# Patient Record
Sex: Female | Born: 1995 | Race: White | Hispanic: No | Marital: Married | Smoking: Never smoker
Health system: Southern US, Community
[De-identification: ages and names within clinical notes are randomized; demographics above are authoritative.]

---

## 2013-06-14 ENCOUNTER — Emergency Department (HOSPITAL_COMMUNITY)
Admission: EM | Admit: 2013-06-14 | Discharge: 2013-06-14 | Disposition: A | Discharge status: Home / Self Care | Payer: BC Managed Care – PPO | Attending: Emergency Medicine | Admitting: Emergency Medicine

## 2013-06-14 ENCOUNTER — Emergency Department (HOSPITAL_COMMUNITY): Payer: BC Managed Care – PPO

## 2013-06-14 ENCOUNTER — Encounter (HOSPITAL_COMMUNITY): Payer: Self-pay | Admitting: Emergency Medicine

## 2013-06-14 DIAGNOSIS — Y9301 Activity, walking, marching and hiking: Secondary | ICD-10-CM | POA: Insufficient documentation

## 2013-06-14 DIAGNOSIS — Y9289 Other specified places as the place of occurrence of the external cause: Secondary | ICD-10-CM | POA: Insufficient documentation

## 2013-06-14 DIAGNOSIS — W010XXA Fall on same level from slipping, tripping and stumbling without subsequent striking against object, initial encounter: Secondary | ICD-10-CM | POA: Insufficient documentation

## 2013-06-14 DIAGNOSIS — S82402A Unspecified fracture of shaft of left fibula, initial encounter for closed fracture: Secondary | ICD-10-CM

## 2013-06-14 DIAGNOSIS — S82899A Other fracture of unspecified lower leg, initial encounter for closed fracture: Secondary | ICD-10-CM | POA: Insufficient documentation

## 2013-06-14 DIAGNOSIS — X500XXA Overexertion from strenuous movement or load, initial encounter: Secondary | ICD-10-CM | POA: Insufficient documentation

## 2013-06-14 MED ORDER — HYDROCODONE-ACETAMINOPHEN 5-325 MG PO TABS: 1.0000 | ORAL_TABLET | Freq: Four times a day (QID) | ORAL | Status: DC | PRN

## 2013-06-14 NOTE — ED Provider Notes (Signed)
CSN: [id number]     Arrival date & time 06/14/13  2050 History  This chart was scribed for non-physician practitioner Felicie Morn, NP  working with Celene Kras, MD by Leone Payor, ED Scribe. This patient was seen in room TR07C/TR07C and the patient's care was started at 2050.    Chief Complaint  Patient presents with  . Ankle Pain    The history is provided by the patient. No language interpreter was used.    HPI Comments: Kristin Sexton is a 17 y.o. female who presents to the Emergency Department complaining of a left ankle injury that occurred a few hours ago. Pt states she was walking in a parking lot when she slipped on something and twisted her left ankle. She has constant, unchanged left ankle pain and swelling to the affected area. She denies any other injuries. She denies any other symptoms.   History reviewed. No pertinent past medical history. History reviewed. No pertinent past surgical history. No family history on file. History  Substance Use Topics  . Smoking status: Never Smoker   . Smokeless tobacco: Not on file  . Alcohol Use: No   OB History   Grav Para Term Preterm Abortions TAB SAB Ect Mult Living                 Review of Systems  Musculoskeletal: Positive for arthralgias ( left ankle pain).  Neurological: Negative for weakness and numbness.  All other systems reviewed and are negative.    Allergies  Review of patient's allergies indicates no known allergies.  Home Medications  No current outpatient prescriptions on file. BP 108/61  Pulse 112  Temp(Src) 98.1 F (36.7 C) (Oral)  Resp 18  Ht 5\' 3"  (1.6 m)  Wt 140 lb (63.504 kg)  BMI 24.81 kg/m2  SpO2 99%  LMP 04/23/2013 Physical Exam  Nursing note and vitals reviewed. Constitutional: She is oriented to person, place, and time. She appears well-developed and well-nourished.  HENT:  Head: Normocephalic and atraumatic.  Cardiovascular: Normal rate.   Pulmonary/Chest: Effort normal.   Abdominal: She exhibits no distension.  Musculoskeletal:  Tenderness at the lateral left ankle. Limited ROM due to pain  Neurological: She is alert and oriented to person, place, and time.  Sensation intact  Skin: Skin is warm and dry.  Good cap refill.   Psychiatric: She has a normal mood and affect.    ED Course  Procedures   DIAGNOSTIC STUDIES: Oxygen Saturation is 99% on RA, normal by my interpretation.    COORDINATION OF CARE: 10:20 PM Discussed treatment plan with pt at bedside and pt agreed to plan.   Labs Review Labs Reviewed - No data to display Imaging Review Dg Ankle Complete Left  06/14/2013   CLINICAL DATA:  Twisting injury to left ankle, lateral left ankle pain.  EXAM: LEFT ANKLE COMPLETE - 3+ VIEW  COMPARISON:  None.  FINDINGS: There appears to be a minimally displaced fracture involving the distal tip of the fibula. No additional fractures are seen. The ankle mortise remains intact.  No significant soft tissue abnormalities are characterized on radiograph. The subtalar joint is unremarkable in appearance.  IMPRESSION: Minimally displaced fracture involving the distal tip of the fibula.   Electronically Signed   By: Roanna Raider M.D.   On: 06/14/2013 21:38    EKG Interpretation   None      Radiology results reviewed and shared with patient. MDM  Distal left fibular fracture.  Cam walker, analgesic,  ortho follow-up.  I personally performed the services described in this documentation, which was scribed in my presence. The recorded information has been reviewed and is accurate.   Jimmye Norman, NP 06/15/13 Marlyne Beards

## 2013-06-14 NOTE — Progress Notes (Signed)
Orthopedic Tech Progress Note Patient Details:  Kristin Sexton August 11, 1995 [id number]  Ortho Devices Type of Ortho Device: CAM walker Ortho Device/Splint Location: LLE Ortho Device/Splint Interventions: Ordered;Application   Jennye Moccasin 06/14/2013, 10:34 PM

## 2013-06-14 NOTE — ED Notes (Signed)
Pt comfortable with d/c and f/u instructions. Prescriptions x1 

## 2013-06-14 NOTE — ED Notes (Signed)
Pt st's she slipped in wet parking lot and turned left ankle over.  Pain and swelling to area.   Ice pack applied.

## 2013-06-15 NOTE — ED Provider Notes (Signed)
Medical screening examination/treatment/procedure(s) were performed by non-physician practitioner and as supervising physician I was immediately available for consultation/collaboration.    Joplin Canty R Daelan Gatt, MD 06/15/13 1530 

## 2014-02-07 ENCOUNTER — Emergency Department (HOSPITAL_COMMUNITY)
Admission: EM | Admit: 2014-02-07 | Discharge: 2014-02-07 | Disposition: A | Discharge status: Home / Self Care | Payer: BC Managed Care – PPO | Attending: Emergency Medicine | Admitting: Emergency Medicine

## 2014-02-07 ENCOUNTER — Encounter (HOSPITAL_COMMUNITY): Payer: Self-pay | Admitting: Emergency Medicine

## 2014-02-07 DIAGNOSIS — S99919A Unspecified injury of unspecified ankle, initial encounter: Principal | ICD-10-CM

## 2014-02-07 DIAGNOSIS — S8990XA Unspecified injury of unspecified lower leg, initial encounter: Secondary | ICD-10-CM | POA: Diagnosis not present

## 2014-02-07 DIAGNOSIS — Y9289 Other specified places as the place of occurrence of the external cause: Secondary | ICD-10-CM | POA: Diagnosis not present

## 2014-02-07 DIAGNOSIS — M79672 Pain in left foot: Secondary | ICD-10-CM

## 2014-02-07 DIAGNOSIS — W010XXA Fall on same level from slipping, tripping and stumbling without subsequent striking against object, initial encounter: Secondary | ICD-10-CM | POA: Insufficient documentation

## 2014-02-07 DIAGNOSIS — S99929A Unspecified injury of unspecified foot, initial encounter: Principal | ICD-10-CM

## 2014-02-07 DIAGNOSIS — Y9389 Activity, other specified: Secondary | ICD-10-CM | POA: Diagnosis not present

## 2014-02-07 NOTE — Discharge Instructions (Signed)

## 2014-02-07 NOTE — ED Provider Notes (Signed)
CSN: [id number]     Arrival date & time 02/07/14  2055 History  This chart was scribed for non-physician practitioner working with Richardean Canalavid H Yao, MD by Elveria Risingimelie Horne, ED Scribe. This patient was seen in room TR11C/TR11C and the patient's care was started at 10:23 PM.   Chief Complaint  Patient presents with  . Leg Pain     The history is provided by the patient. No language interpreter was used.   HPI Comments: Kristin Sexton is a 18 y.o. female who presents to the Emergency Department with left leg injury incurred 4.5 hours ago. Patient reports slipping, falling, and landing directly on her left lower leg. She is now complaining of throbbing pain extending from her lower leg to her left foot.   Patient is ambulatory with pain.  Patient reports previous bone injury at her left ankle in 05/2013.   History reviewed. No pertinent past medical history. History reviewed. No pertinent past surgical history. No family history on file. History  Substance Use Topics  . Smoking status: Never Smoker   . Smokeless tobacco: Not on file  . Alcohol Use: No   OB History   Grav Para Term Preterm Abortions TAB SAB Ect Mult Living                 Review of Systems  Constitutional: Negative for fever and chills.  Musculoskeletal: Positive for arthralgias. Negative for joint swelling.  All other systems reviewed and are negative.   Allergies  Review of patient's allergies indicates no known allergies.  Home Medications   Prior to Admission medications   Not on File   Triage Vitals: BP 112/68  Pulse 95  Temp(Src) 98 F (36.7 C) (Oral)  Ht 5\' 3"  (1.6 m)  SpO2 99%  LMP 11/11/2013  Physical Exam  Nursing note and vitals reviewed. Constitutional: She is oriented to person, place, and time. She appears well-developed and well-nourished.  HENT:  Head: Normocephalic and atraumatic.  Eyes: EOM are normal.  Neck: Neck supple.  Cardiovascular: Normal rate and intact distal pulses.    Pulmonary/Chest: Effort normal.  Musculoskeletal: Normal range of motion.  No malleolar tenderness.  No obvious swelling or deformity. Able to bear weight. Full ROM both actively and passively.   Neurological: She is alert and oriented to person, place, and time.  Skin: Skin is warm and dry.  Psychiatric: She has a normal mood and affect. Her behavior is normal.    ED Course  Procedures (including critical care time)  COORDINATION OF CARE: 10:24 PM- Patient advised to treat with Ace bandage, ice, elevation, and rest. Discussed treatment plan with patient at bedside and patient agreed to plan.   Patient has seen Dr. Roda ShuttersXu in recent past for ankle fracture.  Patient will follow-up with him if no improvement in symptoms.  Labs Review Labs Reviewed - No data to display  Imaging Review No results found.   EKG Interpretation None      MDM   Final diagnoses:  None   Left foot pain.   I personally performed the services described in this documentation, which was scribed in my presence. The recorded information has been reviewed and is accurate.    Jimmye Normanavid John Tanaja Ganger, NP 02/08/14 [phone]

## 2014-02-07 NOTE — ED Notes (Signed)
Pt fell tonight injuring L leg. Pt ambulatory. Distal pulses present.

## 2014-02-08 NOTE — ED Provider Notes (Signed)
Medical screening examination/treatment/procedure(s) were performed by non-physician practitioner and as supervising physician I was immediately available for consultation/collaboration.   EKG Interpretation None        Richardean Canalavid H Jupiter Boys, MD 02/08/14 [phone]

## 2014-05-25 ENCOUNTER — Encounter (HOSPITAL_COMMUNITY): Payer: Self-pay | Admitting: Emergency Medicine

## 2014-05-25 ENCOUNTER — Emergency Department (HOSPITAL_COMMUNITY)
Admission: EM | Admit: 2014-05-25 | Discharge: 2014-05-26 | Disposition: A | Discharge status: Home / Self Care | Payer: BC Managed Care – PPO | Attending: Emergency Medicine | Admitting: Emergency Medicine

## 2014-05-25 DIAGNOSIS — R109 Unspecified abdominal pain: Secondary | ICD-10-CM

## 2014-05-25 DIAGNOSIS — N939 Abnormal uterine and vaginal bleeding, unspecified: Secondary | ICD-10-CM | POA: Diagnosis not present

## 2014-05-25 DIAGNOSIS — Z3202 Encounter for pregnancy test, result negative: Secondary | ICD-10-CM | POA: Diagnosis not present

## 2014-05-25 DIAGNOSIS — R1032 Left lower quadrant pain: Secondary | ICD-10-CM | POA: Insufficient documentation

## 2014-05-25 LAB — URINALYSIS, ROUTINE W REFLEX MICROSCOPIC
Bilirubin Urine: NEGATIVE
GLUCOSE, UA: NEGATIVE mg/dL
Ketones, ur: NEGATIVE mg/dL
LEUKOCYTES UA: NEGATIVE
NITRITE: NEGATIVE
PH: 7 (ref 5.0–8.0)
Protein, ur: NEGATIVE mg/dL
SPECIFIC GRAVITY, URINE: 1.006 (ref 1.005–1.030)
Urobilinogen, UA: 0.2 mg/dL (ref 0.0–1.0)

## 2014-05-25 LAB — CBC WITH DIFFERENTIAL/PLATELET
BASOS PCT: 1 % (ref 0–1)
Basophils Absolute: 0.1 10*3/uL (ref 0.0–0.1)
EOS ABS: 0.1 10*3/uL (ref 0.0–0.7)
Eosinophils Relative: 2 % (ref 0–5)
HCT: 41.9 % (ref 36.0–46.0)
Hemoglobin: 15.2 g/dL — ABNORMAL HIGH (ref 12.0–15.0)
Lymphocytes Relative: 39 % (ref 12–46)
Lymphs Abs: 2.9 10*3/uL (ref 0.7–4.0)
MCH: 28.7 pg (ref 26.0–34.0)
MCHC: 36.3 g/dL — AB (ref 30.0–36.0)
MCV: 79.1 fL (ref 78.0–100.0)
Monocytes Absolute: 0.5 10*3/uL (ref 0.1–1.0)
Monocytes Relative: 7 % (ref 3–12)
Neutro Abs: 3.9 10*3/uL (ref 1.7–7.7)
Neutrophils Relative %: 53 % (ref 43–77)
PLATELETS: 284 10*3/uL (ref 150–400)
RBC: 5.3 MIL/uL — ABNORMAL HIGH (ref 3.87–5.11)
RDW: 12.7 % (ref 11.5–15.5)
WBC: 7.5 10*3/uL (ref 4.0–10.5)

## 2014-05-25 LAB — COMPREHENSIVE METABOLIC PANEL
ALT: 24 U/L (ref 0–35)
ANION GAP: 18 — AB (ref 5–15)
AST: 21 U/L (ref 0–37)
Albumin: 4.5 g/dL (ref 3.5–5.2)
Alkaline Phosphatase: 129 U/L — ABNORMAL HIGH (ref 39–117)
BUN: 8 mg/dL (ref 6–23)
CALCIUM: 9.5 mg/dL (ref 8.4–10.5)
CO2: 20 mEq/L (ref 19–32)
Chloride: 103 mEq/L (ref 96–112)
Creatinine, Ser: 0.73 mg/dL (ref 0.50–1.10)
GFR calc Af Amer: >90 mL/min (ref >90)
GFR calc non Af Amer: >90 mL/min (ref >90)
Glucose, Bld: 82 mg/dL (ref 70–99)
Potassium: 3.8 mEq/L (ref 3.7–5.3)
SODIUM: 141 meq/L (ref 137–147)
TOTAL PROTEIN: 8.5 g/dL — AB (ref 6.0–8.3)
Total Bilirubin: 0.4 mg/dL (ref 0.3–1.2)

## 2014-05-25 LAB — WET PREP, GENITAL
CLUE CELLS WET PREP: NONE SEEN
Trich, Wet Prep: NONE SEEN
Yeast Wet Prep HPF POC: NONE SEEN

## 2014-05-25 LAB — POC URINE PREG, ED: PREG TEST UR: NEGATIVE

## 2014-05-25 LAB — URINE MICROSCOPIC-ADD ON

## 2014-05-25 LAB — LIPASE, BLOOD: Lipase: 34 U/L (ref 11–59)

## 2014-05-25 MED ORDER — SODIUM CHLORIDE 0.9 % IV BOLUS (SEPSIS)
1000.0000 mL | Freq: Once | INTRAVENOUS | Status: AC
  Administered 2014-05-25: 1000 mL via INTRAVENOUS

## 2014-05-25 MED ORDER — KETOROLAC TROMETHAMINE 30 MG/ML IJ SOLN
30.0000 mg | Freq: Once | INTRAMUSCULAR | Status: AC
  Administered 2014-05-25: 30 mg via INTRAVENOUS
  Filled 2014-05-25: qty 1

## 2014-05-25 NOTE — ED Provider Notes (Signed)
CSN: [id number]     Arrival date & time 05/25/14  1748 History   First MD Initiated Contact with Patient 05/25/14 2103     Chief Complaint  Patient presents with  . Abdominal Pain     (Consider location/radiation/quality/duration/timing/severity/associated sxs/prior Treatment) HPI  Kristin Sexton is a 18 y.o. female  presenting with 2 days of intermittent left lower quadrant pain that is sharp and worse with movement. Patient also noted some vaginal bleeding. She has been on Depo-Provera and has not had a period since June. Patient denies nausea, vomiting, diarrhea. Last bowel movement yesterday normal, nonbloody. Patient denies vaginal pain but endorses increase in vaginal discharge without odor. Patient denies new sexual partners or history of STDs. Patient denies abdominal surgeries. Patient denies fevers, chills. She does note some mild bilateral back soreness after lifting yesterday. Patient does not have PCP. She denies urinary complaints.    History reviewed. No pertinent past medical history. History reviewed. No pertinent past surgical history. History reviewed. No pertinent family history. History  Substance Use Topics  . Smoking status: Never Smoker   . Smokeless tobacco: Not on file  . Alcohol Use: No   OB History    No data available     Review of Systems  Constitutional: Negative for fever and chills.  HENT: Negative for congestion and rhinorrhea.   Eyes: Negative for visual disturbance.  Respiratory: Negative for cough and shortness of breath.   Cardiovascular: Negative for chest pain.  Gastrointestinal: Positive for abdominal pain. Negative for nausea, vomiting and diarrhea.  Genitourinary: Negative for dysuria and hematuria.  Musculoskeletal: Positive for back pain. Negative for gait problem.  Skin: Negative for rash.  Neurological: Negative for weakness and headaches.      Allergies  Review of patient's allergies indicates no known allergies.  Home  Medications   Prior to Admission medications   Medication Sig Start Date End Date Taking? Authorizing Provider  medroxyPROGESTERone (DEPO-PROVERA) 150 MG/ML injection Inject 150 mg into the muscle every 3 (three) months. 03/16/14  Yes Historical Provider, MD   BP 108/72 mmHg  Pulse 103  Temp(Src) 97.8 F (36.6 C) (Oral)  Resp 18  SpO2 100% Physical Exam  Constitutional: She appears well-developed and well-nourished. No distress.  HENT:  Head: Normocephalic and atraumatic.  Eyes: Conjunctivae and EOM are normal. Right eye exhibits no discharge. Left eye exhibits no discharge.  Cardiovascular: Normal rate, regular rhythm and normal heart sounds.   Pulmonary/Chest: Effort normal and breath sounds normal. No respiratory distress. She has no wheezes.  Abdominal: Soft. Bowel sounds are normal. She exhibits no distension.  Bilateral mild lower abdominal tenderness without rebound, rigidity, guarding. No CVA tenderness or back tenderness.  Genitourinary:  Cervix pink without lesions. Os closed. No CMT mild right and left adnexal tenderness. No masses noted. Minimal discharge without foul odor. Nursing tech in room for exam.   Neurological: She is alert. She exhibits normal muscle tone. Coordination normal.  Skin: Skin is warm and dry. She is not diaphoretic.  Nursing note and vitals reviewed.   ED Course  Procedures (including critical care time) Labs Review Labs Reviewed  WET PREP, GENITAL - Abnormal; Notable for the following:    WBC, Wet Prep HPF POC FEW (*)    All other components within normal limits  URINALYSIS, ROUTINE W REFLEX MICROSCOPIC - Abnormal; Notable for the following:    Hgb urine dipstick LARGE (*)    All other components within normal limits  URINE MICROSCOPIC-ADD ON -  Abnormal; Notable for the following:    Squamous Epithelial / LPF FEW (*)    All other components within normal limits  CBC WITH DIFFERENTIAL - Abnormal; Notable for the following:    RBC 5.30 (*)     Hemoglobin 15.2 (*)    MCHC 36.3 (*)    All other components within normal limits  COMPREHENSIVE METABOLIC PANEL - Abnormal; Notable for the following:    Total Protein 8.5 (*)    Alkaline Phosphatase 129 (*)    Anion gap 18 (*)    All other components within normal limits  GC/CHLAMYDIA PROBE AMP  LIPASE, BLOOD  POC URINE PREG, ED    Imaging Review No results found.   EKG Interpretation None      MDM   Final diagnoses:  Abdominal pain in female   Patient with intermittent sharp abdominal pain in lower abdomen for 2 days. She also notes vaginal bleeding. Patient without nausea, vomiting, diarrhea, hematochezia. No fevers or chills. VSS. Mild lower abdominal tenderness without rebound guarding or rigidity. Patient with nonsurgical abdomen. Pelvic exam with normal discomfort. No CMT or findings concerning for PID. Patient given IV Toradol with resolution of her pain. Patient without a white count. Other labs noncontributory. Wet Prep with few white blood cells. UA without evidence of infection. GC and Chlamydia pending. Discussed option with further imaging with patient. Patient refused further imaging and I think this is reasonable considering she is without pain. Repeat abdominal exam without tenderness. Patient given strict return precautions verbally and in writing. Pt to treat with ibuprofen. Patient without PCP and to follow-up with wellness center. Resources provided.  Discussed return precautions with patient. Discussed all results and patient verbalizes understanding and agrees with plan.  Case has been discussed with Dr. Judd Lienelo who agrees with the above plan and to discharge.      Louann SjogrenVictoria L Clotilde Loth, PA-C 05/26/14 [phone]  Geoffery Lyonsouglas Delo, MD 05/28/14 [phone]

## 2014-05-25 NOTE — ED Notes (Signed)
Nurse first rounds-pt updated on time and wait.

## 2014-05-25 NOTE — ED Notes (Signed)
Pelvic cart placed outside of room 

## 2014-05-25 NOTE — ED Notes (Signed)
Pt c/o LLQ pain x 2 days with some vaginal spotting; pt sts on depo and usually does not have periods

## 2014-05-26 NOTE — Discharge Instructions (Signed)
Return to the emergency room with worsening of symptoms, new symptoms or with symptoms that are concerning, especially fevers, chills, unable to tolerate fluids, blood in stool, worsening or persistent pain in 12 hours, rigid abdomen, you feel faint, blood in your urine. Ibuprofen 400mg  (2 tablets 200mg ) every 5-6 hours for 3-5 days and then as needed for pain. Call made an appointment with the wellness center to follow-up. Number provided above.  Abdominal Pain, Women Abdominal (stomach, pelvic, or belly) pain can be caused by many things. It is important to tell your doctor:  The location of the pain.  Does it come and go or is it present all the time?  Are there things that start the pain (eating certain foods, exercise)?  Are there other symptoms associated with the pain (fever, nausea, vomiting, diarrhea)? All of this is helpful to know when trying to find the cause of the pain. CAUSES   Stomach: virus or bacteria infection, or ulcer.  Intestine: appendicitis (inflamed appendix), regional ileitis (Crohn's disease), ulcerative colitis (inflamed colon), irritable bowel syndrome, diverticulitis (inflamed diverticulum of the colon), or cancer of the stomach or intestine.  Gallbladder disease or stones in the gallbladder.  Kidney disease, kidney stones, or infection.  Pancreas infection or cancer.  Fibromyalgia (pain disorder).  Diseases of the female organs:  Uterus: fibroid (non-cancerous) tumors or infection.  Fallopian tubes: infection or tubal pregnancy.  Ovary: cysts or tumors.  Pelvic adhesions (scar tissue).  Endometriosis (uterus lining tissue growing in the pelvis and on the pelvic organs).  Pelvic congestion syndrome (female organs filling up with blood just before the menstrual period).  Pain with the menstrual period.  Pain with ovulation (producing an egg).  Pain with an IUD (intrauterine device, birth control) in the uterus.  Cancer of the female  organs.  Functional pain (pain not caused by a disease, may improve without treatment).  Psychological pain.  Depression. DIAGNOSIS  Your doctor will decide the seriousness of your pain by doing an examination.  Blood tests.  X-rays.  Ultrasound.  CT scan (computed tomography, special type of X-ray).  MRI (magnetic resonance imaging).  Cultures, for infection.  Barium enema (dye inserted in the large intestine, to better view it with X-rays).  Colonoscopy (looking in intestine with a lighted tube).  Laparoscopy (minor surgery, looking in abdomen with a lighted tube).  Major abdominal exploratory surgery (looking in abdomen with a large incision). TREATMENT  The treatment will depend on the cause of the pain.   Many cases can be observed and treated at home.  Over-the-counter medicines recommended by your caregiver.  Prescription medicine.  Antibiotics, for infection.  Birth control pills, for painful periods or for ovulation pain.  Hormone treatment, for endometriosis.  Nerve blocking injections.  Physical therapy.  Antidepressants.  Counseling with a psychologist or psychiatrist.  Minor or major surgery. HOME CARE INSTRUCTIONS   Do not take laxatives, unless directed by your caregiver.  Take over-the-counter pain medicine only if ordered by your caregiver. Do not take aspirin because it can cause an upset stomach or bleeding.  Try a clear liquid diet (broth or water) as ordered by your caregiver. Slowly move to a bland diet, as tolerated, if the pain is related to the stomach or intestine.  Have a thermometer and take your temperature several times a day, and record it.  Bed rest and sleep, if it helps the pain.  Avoid sexual intercourse, if it causes pain.  Avoid stressful situations.  Keep your follow-up  appointments and tests, as your caregiver orders.  If the pain does not go away with medicine or surgery, you may  try:  Acupuncture.  Relaxation exercises (yoga, meditation).  Group therapy.  Counseling. SEEK MEDICAL CARE IF:   You notice certain foods cause stomach pain.  Your home care treatment is not helping your pain.  You need stronger pain medicine.  You want your IUD removed.  You feel faint or lightheaded.  You develop nausea and vomiting.  You develop a rash.  You are having side effects or an allergy to your medicine. SEEK IMMEDIATE MEDICAL CARE IF:   Your pain does not go away or gets worse.  You have a fever.  Your pain is felt only in portions of the abdomen. The right side could possibly be appendicitis. The left lower portion of the abdomen could be colitis or diverticulitis.  You are passing blood in your stools (bright red or black tarry stools, with or without vomiting).  You have blood in your urine.  You develop chills, with or without a fever.  You pass out. MAKE SURE YOU:   Understand these instructions.  Will watch your condition.  Will get help right away if you are not doing well or get worse. Document Released: 04/06/2007 Document Revised: 10/24/2013 Document Reviewed: 04/26/2009 [facility] Patient Information 2015 [employer], [place]. This information is not intended to replace advice given to you by your health care provider. Make sure you discuss any questions you have with your health care provider.   Abdominal Pain Many things can cause abdominal pain. Usually, abdominal pain is not caused by a disease and will improve without treatment. It can often be observed and treated at home. Your health care provider will do a physical exam and possibly order blood tests and X-rays to help determine the seriousness of your pain. However, in many cases, more time must pass before a clear cause of the pain can be found. Before that point, your health care provider may not know if you need more testing or further treatment. HOME CARE INSTRUCTIONS  Monitor your  abdominal pain for any changes. The following actions may help to alleviate any discomfort you are experiencing:  Only take over-the-counter or prescription medicines as directed by your health care provider.  Do not take laxatives unless directed to do so by your health care provider.  Try a clear liquid diet (broth, tea, or water) as directed by your health care provider. Slowly move to a bland diet as tolerated. SEEK MEDICAL CARE IF:  You have unexplained abdominal pain.  You have abdominal pain associated with nausea or diarrhea.  You have pain when you urinate or have a bowel movement.  You experience abdominal pain that wakes you in the night.  You have abdominal pain that is worsened or improved by eating food.  You have abdominal pain that is worsened with eating fatty foods.  You have a fever. SEEK IMMEDIATE MEDICAL CARE IF:   Your pain does not go away within 2 hours.  You keep throwing up (vomiting).  Your pain is felt only in portions of the abdomen, such as the right side or the left lower portion of the abdomen.  You pass bloody or black tarry stools. MAKE SURE YOU:  Understand these instructions.   Will watch your condition.   Will get help right away if you are not doing well or get worse.  Document Released: 03/19/2005 Document Revised: 06/14/2013 Document Reviewed: 02/16/2013 [employer] Patient Information  2015 [employer]. This information is not intended to replace advice given to you by your health care provider. Make sure you discuss any questions you have with your health care provider.

## 2014-05-27 LAB — GC/CHLAMYDIA PROBE AMP
CT PROBE, AMP APTIMA: NEGATIVE
GC PROBE AMP APTIMA: NEGATIVE

## 2014-07-25 ENCOUNTER — Emergency Department (HOSPITAL_COMMUNITY)
Admission: EM | Admit: 2014-07-25 | Discharge: 2014-07-25 | Disposition: A | Discharge status: Home / Self Care | Payer: BLUE CROSS/BLUE SHIELD | Attending: Emergency Medicine | Admitting: Emergency Medicine

## 2014-07-25 ENCOUNTER — Encounter (HOSPITAL_COMMUNITY): Payer: Self-pay | Admitting: Emergency Medicine

## 2014-07-25 DIAGNOSIS — S46911A Strain of unspecified muscle, fascia and tendon at shoulder and upper arm level, right arm, initial encounter: Secondary | ICD-10-CM | POA: Diagnosis not present

## 2014-07-25 DIAGNOSIS — S4991XA Unspecified injury of right shoulder and upper arm, initial encounter: Secondary | ICD-10-CM | POA: Diagnosis present

## 2014-07-25 DIAGNOSIS — X58XXXA Exposure to other specified factors, initial encounter: Secondary | ICD-10-CM | POA: Insufficient documentation

## 2014-07-25 DIAGNOSIS — Y9389 Activity, other specified: Secondary | ICD-10-CM | POA: Diagnosis not present

## 2014-07-25 DIAGNOSIS — S46811A Strain of other muscles, fascia and tendons at shoulder and upper arm level, right arm, initial encounter: Secondary | ICD-10-CM

## 2014-07-25 DIAGNOSIS — S199XXA Unspecified injury of neck, initial encounter: Secondary | ICD-10-CM | POA: Insufficient documentation

## 2014-07-25 DIAGNOSIS — Y998 Other external cause status: Secondary | ICD-10-CM | POA: Insufficient documentation

## 2014-07-25 DIAGNOSIS — Y9289 Other specified places as the place of occurrence of the external cause: Secondary | ICD-10-CM | POA: Insufficient documentation

## 2014-07-25 MED ORDER — MELOXICAM 15 MG PO TABS: 15.0000 mg | ORAL_TABLET | Freq: Every day | ORAL | Status: DC

## 2014-07-25 MED ORDER — METHOCARBAMOL 500 MG PO TABS: 500.0000 mg | ORAL_TABLET | Freq: Two times a day (BID) | ORAL | Status: DC

## 2014-07-25 NOTE — ED Provider Notes (Signed)
CSN: [id number]     Arrival date & time 07/25/14  1012 History  This chart was scribed for non-physician practitioner Emilia Beck, working with Lyanne Co, MD by Carl Best, ED Scribe. This patient was seen in room TR09C/TR09C and the patient's care was started at 11:18 AM.   Chief Complaint  Patient presents with  . Shoulder Pain   HPI Comments: Kristin Sexton is a 19 y.o. female who presents to the Emergency Department complaining of constant right sided neck pain radiating to her right shoulder that started yesterday morning when she woke up.  The pain is aching and severe. She denies any injury to the area.  She is left hand dominant. No treatment tried at home. Movement and palpation makes the pain worse. No alleviating factors.     Patient is a 19 y.o. female presenting with shoulder pain. The history is provided by the patient. No language interpreter was used.  Shoulder Pain Associated symptoms: neck pain     History reviewed. No pertinent past medical history. History reviewed. No pertinent past surgical history. No family history on file. History  Substance Use Topics  . Smoking status: Never Smoker   . Smokeless tobacco: Not on file  . Alcohol Use: No   OB History    No data available     Review of Systems  Musculoskeletal: Positive for arthralgias and neck pain.  All other systems reviewed and are negative.     Allergies  Review of patient's allergies indicates no known allergies.  Home Medications   Prior to Admission medications   Medication Sig Start Date End Date Taking? Authorizing Provider  medroxyPROGESTERone (DEPO-PROVERA) 150 MG/ML injection Inject 150 mg into the muscle every 3 (three) months. 03/16/14   Historical Provider, MD   Triage Vitals: BP 104/62 mmHg  Pulse 98  Temp(Src) 98 F (36.7 C) (Oral)  Resp 18  Ht  (1.6 m)  Wt 160 lb (72.576 kg)  BMI 28.35 kg/m2  SpO2 98%  LMP 07/11/2014  Physical Exam  Constitutional:  She is oriented to person, place, and time. She appears well-developed and well-nourished. No distress.  HENT:  Head: Normocephalic and atraumatic.  Eyes: Conjunctivae and EOM are normal.  Neck: Normal range of motion.  Cardiovascular: Normal rate and regular rhythm.  Exam reveals no gallop and no friction rub.   No murmur heard. Pulmonary/Chest: Effort normal and breath sounds normal. She has no wheezes. She has no rales. She exhibits no tenderness.  Abdominal: Soft. She exhibits no distension. There is no tenderness. There is no rebound.  Musculoskeletal: Normal range of motion.  No midline spine tenderness to palpation. Right trapezius tenderness to palpation.   Neurological: She is alert and oriented to person, place, and time. Coordination normal.  Upper extremity strength and sensation equal and intact bilaterally. Speech is goal-oriented. Moves limbs without ataxia.   Skin: Skin is warm and dry.  Psychiatric: She has a normal mood and affect. Her behavior is normal.  Nursing note and vitals reviewed.   ED Course  Procedures (including critical care time)  DIAGNOSTIC STUDIES: Oxygen Saturation is 98% on room air, normal by my interpretation.    COORDINATION OF CARE: 11:21 AM - Discussed a clinical suspicion of muscle tension in the right trapezius.  Will discharge the patient with an antiinflammatory medication, muscle relaxer, and a work note.  Advised the patient to apply a heating pad to the area for an hour.  The patient agreed to the  treatment plan.  Labs Review Labs Reviewed - No data to display  Imaging Review No results found.   EKG Interpretation None      MDM   Final diagnoses:  Strain of right trapezius muscle, initial encounter    Patient likely has a muscle strain of the right trapezius muscle. No bony tenderness to palpation. Patient will have mobic and robaxin for symptoms. Vitals stable and patient afebrile. No injury.   I personally performed the  services described in this documentation, which was scribed in my presence. The recorded information has been reviewed and is accurate.    [address] Lake LaneKaitlyn PlushSzekalski, PA-C 07/26/14 [id number]  Lyanne CoKevin M Campos, MD 08/01/14 [phone]

## 2014-07-25 NOTE — ED Notes (Signed)
Patient states R shoulder / neck pain that started yesterday morning.   Patient denies injury.   Patient states didn't take anything at home for the pain, "I don't like taking medicine".

## 2014-07-25 NOTE — Discharge Instructions (Signed)
Take mobic as needed for pain. Take robaxin as needed for muscle spasm. You may take these medications together. Apply heat to the affected area. Refer to attached documents for more information.

## 2014-11-30 IMAGING — CR DG ANKLE COMPLETE 3+V*L*
3 series · 3 of 3 positions shown · non-contrast
Comparison: None.

CLINICAL DATA: Twisting injury to left ankle, lateral left ankle
pain.

EXAM:
LEFT ANKLE COMPLETE - 3+ VIEW

[t ankle joint ap left]
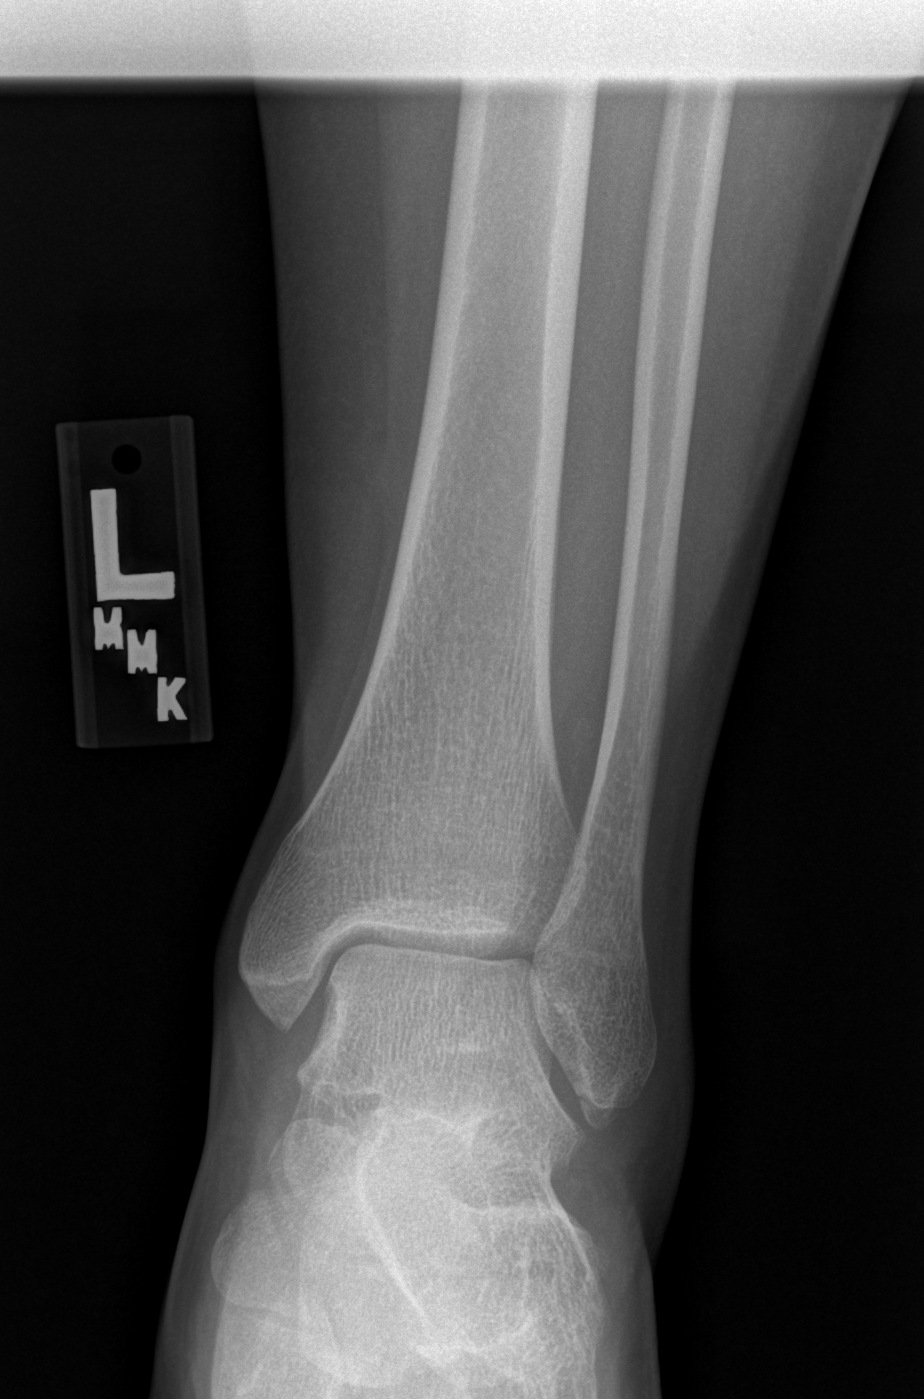

[t ankle joint oblique left]
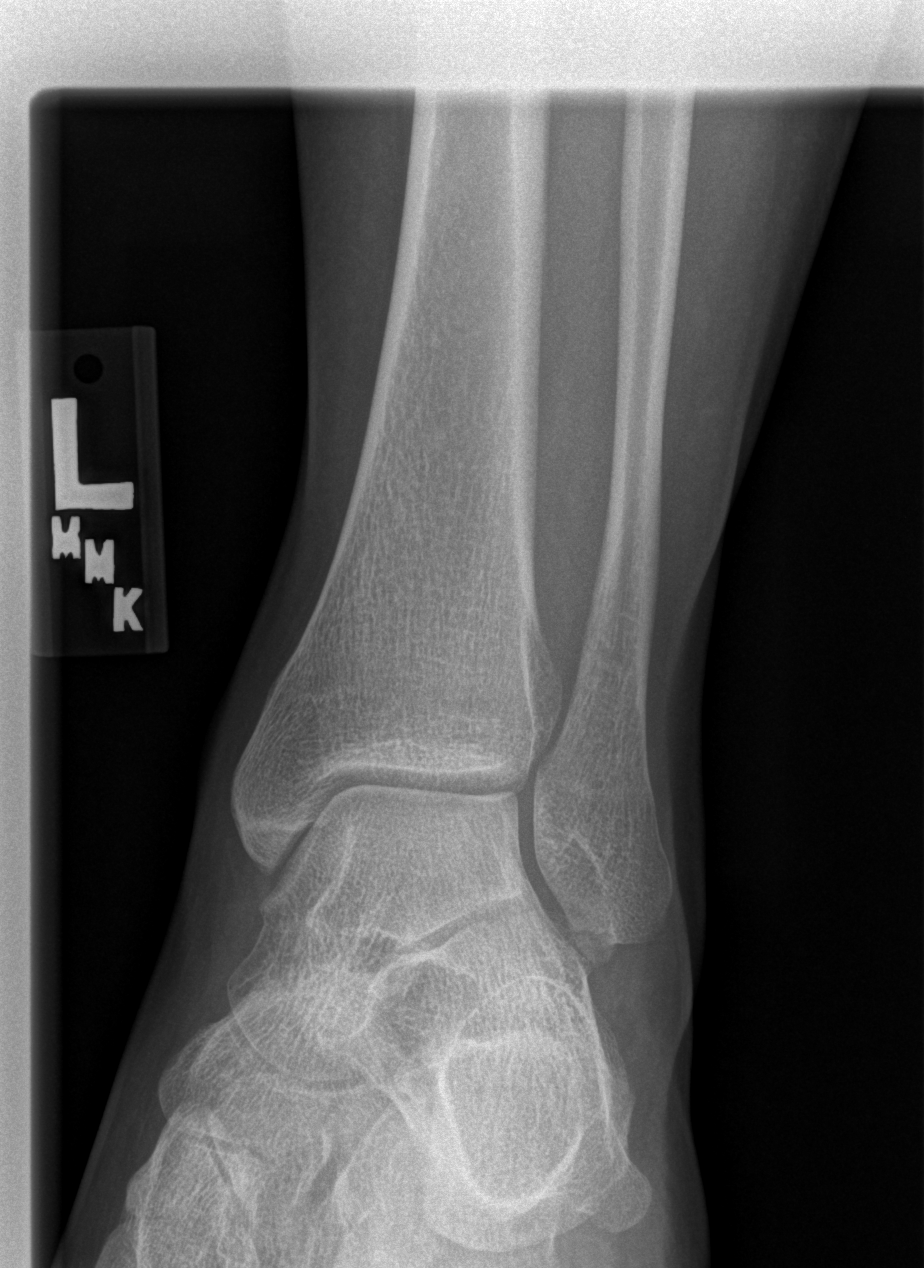

[t ankle joint lat left]
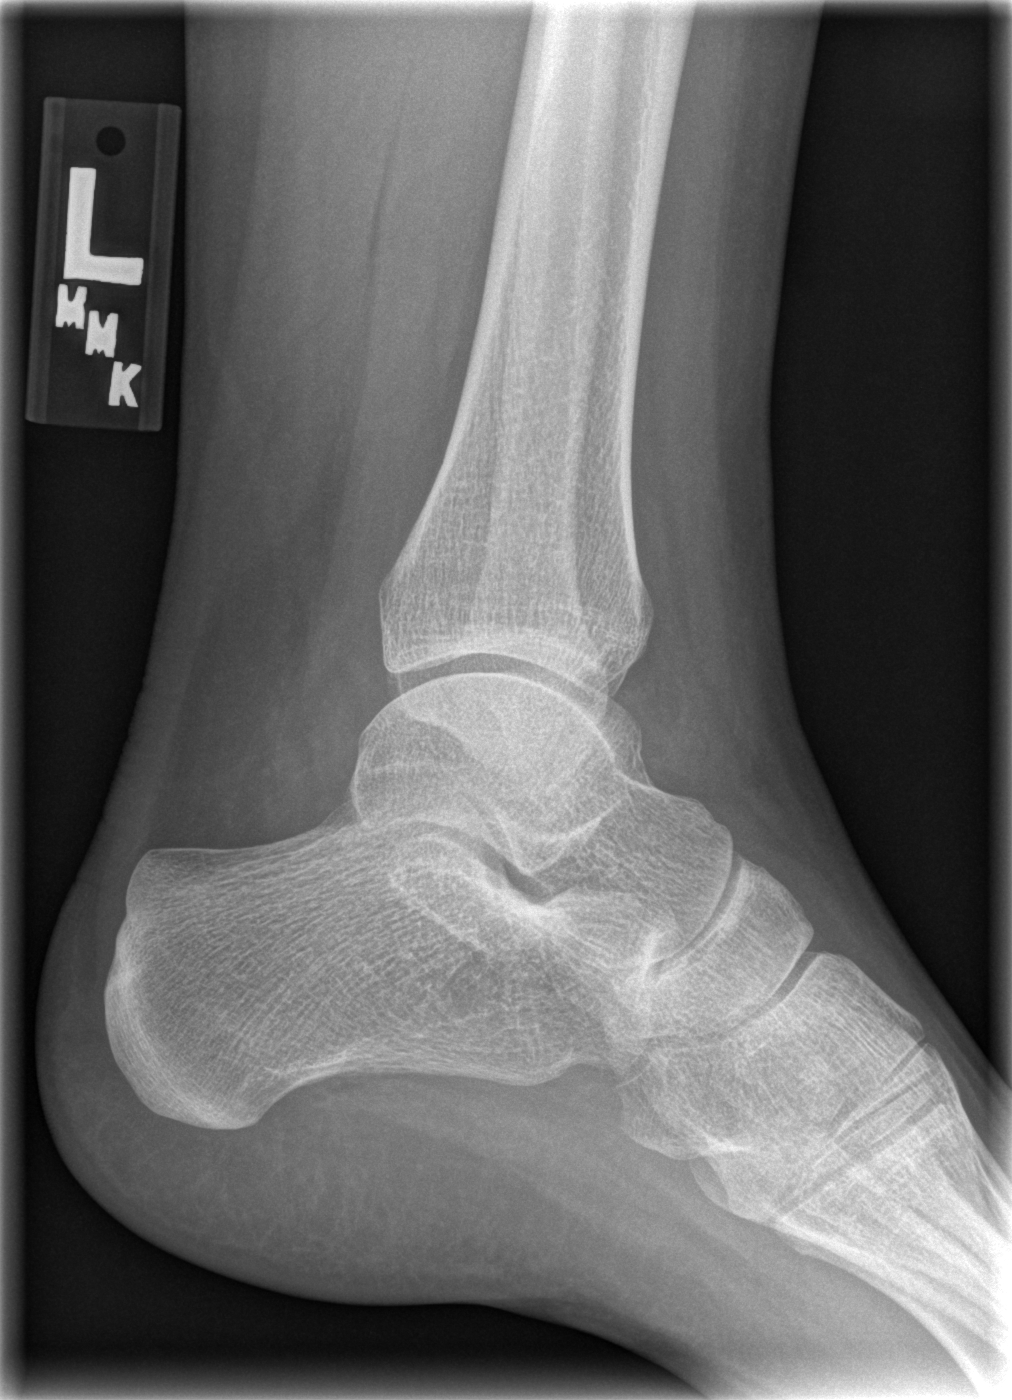

[3 of 3 positions shown; findings below may reference images not displayed]

FINDINGS: There appears to be a minimally displaced fracture involving the
distal tip of the fibula. No additional fractures are seen. The
ankle mortise remains intact.

No significant soft tissue abnormalities are characterized on
radiograph. The subtalar joint is unremarkable in appearance.
IMPRESSION: Minimally displaced fracture involving the distal tip of the fibula.

## 2015-09-21 ENCOUNTER — Emergency Department (HOSPITAL_COMMUNITY)
Admission: EM | Admit: 2015-09-21 | Discharge: 2015-09-21 | Disposition: A | Discharge status: Home / Self Care | Payer: BLUE CROSS/BLUE SHIELD | Attending: Emergency Medicine | Admitting: Emergency Medicine

## 2015-09-21 ENCOUNTER — Encounter (HOSPITAL_COMMUNITY): Payer: Self-pay | Admitting: Emergency Medicine

## 2015-09-21 DIAGNOSIS — J020 Streptococcal pharyngitis: Secondary | ICD-10-CM | POA: Insufficient documentation

## 2015-09-21 DIAGNOSIS — Z791 Long term (current) use of non-steroidal anti-inflammatories (NSAID): Secondary | ICD-10-CM | POA: Diagnosis not present

## 2015-09-21 DIAGNOSIS — Z79899 Other long term (current) drug therapy: Secondary | ICD-10-CM | POA: Insufficient documentation

## 2015-09-21 DIAGNOSIS — J029 Acute pharyngitis, unspecified: Secondary | ICD-10-CM | POA: Diagnosis present

## 2015-09-21 LAB — RAPID STREP SCREEN (MED CTR MEBANE ONLY): STREPTOCOCCUS, GROUP A SCREEN (DIRECT): POSITIVE — AB

## 2015-09-21 MED ORDER — PENICILLIN G BENZATHINE 1200000 UNIT/2ML IM SUSP
1.2000 10*6.[IU] | Freq: Once | INTRAMUSCULAR | Status: AC
  Administered 2015-09-21: 1.2 10*6.[IU] via INTRAMUSCULAR
  Filled 2015-09-21: qty 2

## 2015-09-21 NOTE — ED Provider Notes (Signed)
CSN: [id number]     Arrival date & time 09/21/15  1346 History  By signing my name below, I, Kristin Sexton, attest that this documentation has been prepared under the direction and in the presence of [name], PA-C Electronically Signed: Charline BillsEssence Sexton, ED Scribe 09/21/2015 at 2:34 PM.   Chief Complaint  Patient presents with  . Sore Throat   The history is provided by the patient. No language interpreter was used.   HPI Comments: Aletta EdouardMiranda Rishi Sexton is a 20 y.o. female who presents to the Emergency Department with a chief complaint of persistent sore throat onset 4 days ago. Pt reports associated subjective fever 2 days ago that has resolved. No treatments tried PTA. She denies cough. No sick contacts with similar symptoms. No known drug allergies.   History reviewed. No pertinent past medical history. History reviewed. No pertinent past surgical history. No family history on file. Social History  Substance Use Topics  . Smoking status: Never Smoker   . Smokeless tobacco: None  . Alcohol Use: No   OB History    No data available     Review of Systems  Constitutional: Positive for fever (subjective).  HENT: Positive for sore throat.   Respiratory: Negative for cough.    Allergies  Review of patient's allergies indicates no known allergies.  Home Medications   Prior to Admission medications   Medication Sig Start Date End Date Taking? Authorizing Provider  medroxyPROGESTERone (DEPO-PROVERA) 150 MG/ML injection Inject 150 mg into the muscle every 3 (three) months. 03/16/14   Historical Provider, MD  meloxicam (MOBIC) 15 MG tablet Take 1 tablet (15 mg total) by mouth daily. 07/25/14   Emilia BeckKaitlyn Szekalski, PA-C  methocarbamol (ROBAXIN) 500 MG tablet Take 1 tablet (500 mg total) by mouth 2 (two) times daily. 07/25/14   Kaitlyn Szekalski, PA-C   BP 111/63 mmHg  Pulse 97  Temp(Src) 98.2 F (36.8 C) (Oral)  Resp 16  SpO2 100%  LMP 09/03/2015 Physical Exam Physical Exam   Constitutional: Pt  is oriented to person, place, and time. Appears well-developed and well-nourished. No distress.  HENT:  Head: Normocephalic and atraumatic.  Right Ear: Tympanic membrane, external ear and ear canal normal.  Left Ear: Tympanic membrane, external ear and ear canal normal.  Nose: No mucosal edema and no rhinorrhea present. No epistaxis. Right sinus exhibits no maxillary sinus tenderness and no frontal sinus tenderness. Left sinus exhibits no maxillary sinus tenderness and no frontal sinus tenderness.  Mouth/Throat: Uvula is midline and mucous membranes are normal. Mucous membranes are not pale and not cyanotic. Oropharynx is erythematous. No oropharyngeal exudate or tonsillar abscess.  Eyes: Conjunctivae are normal. Pupils are equal, round, and reactive to light.  Neck: Normal range of motion and full passive range of motion without pain.  Cardiovascular: Normal rate and intact distal pulses.   Pulmonary/Chest: Effort normal and breath sounds normal. No stridor.  Clear and equal breath sounds without focal wheezes, rhonchi, rales  Abdominal: Soft. Bowel sounds are normal. There is no tenderness.  Musculoskeletal: Normal range of motion.  Lymphadenopathy:    Pthas no cervical adenopathy.  Neurological: Pt is alert and oriented to person, place, and time.  Skin: Skin is warm and dry. No rash noted. Pt is not diaphoretic.  Psychiatric: Normal mood and affect.  Nursing note and vitals reviewed.  ED Course  Procedures (including critical care time) DIAGNOSTIC STUDIES: Oxygen Saturation is 100% on RA, normal by my interpretation.    COORDINATION OF CARE: 2:29  PM-Discussed treatment plan which includes strep screen and Bicillina LA injection with pt at bedside and pt agreed to plan.   Labs Review Labs Reviewed  RAPID STREP SCREEN (NOT AT [facility]) - Abnormal; Notable for the following:    Streptococcus, Group A Screen (Direct) POSITIVE (*)    All other components within  normal limits    MDM   Final diagnoses:  Strep throat    Pt febrile with tonsillar exudate, cervical lymphadenopathy, & dysphagia; diagnosis of strep. Treated in the ED PCN IM.  Pt appears mildly dehydrated, discussed importance of water rehydration. Presentation non concerning for PTA or infxn spread to soft tissue. No trismus or uvula deviation. Specific return precautions discussed. Pt able to drink water in ED without difficulty with intact air way. Recommended PCP follow up.    I personally performed the services described in this documentation, which was scribed in my presence. The recorded information has been reviewed and is accurate.     Roxy Horseman, PA-C 09/21/15 1457  Donnetta Hutching, MD 09/21/15 1536

## 2015-09-21 NOTE — Discharge Instructions (Signed)

## 2015-09-21 NOTE — ED Notes (Signed)
Started 4 days ago with sore throat. States had fever days 1-3. Pt appears pale.

## 2015-12-02 ENCOUNTER — Emergency Department (HOSPITAL_COMMUNITY)
Admission: EM | Admit: 2015-12-02 | Discharge: 2015-12-03 | Disposition: A | Discharge status: Home / Self Care | Payer: BLUE CROSS/BLUE SHIELD | Attending: Emergency Medicine | Admitting: Emergency Medicine

## 2015-12-02 ENCOUNTER — Encounter (HOSPITAL_COMMUNITY): Payer: Self-pay | Admitting: Emergency Medicine

## 2015-12-02 DIAGNOSIS — J029 Acute pharyngitis, unspecified: Secondary | ICD-10-CM

## 2015-12-02 NOTE — ED Notes (Signed)
Pt. reports sore throat with swelling onset Thursday with fever yesterday . Airway intact/respirations unlabored .

## 2015-12-03 LAB — RAPID STREP SCREEN (MED CTR MEBANE ONLY): STREPTOCOCCUS, GROUP A SCREEN (DIRECT): NEGATIVE

## 2015-12-03 MED ORDER — HYDROCODONE-ACETAMINOPHEN 7.5-325 MG/15ML PO SOLN: 15.0000 mL | Freq: Three times a day (TID) | ORAL | Status: AC | PRN

## 2015-12-03 NOTE — ED Provider Notes (Signed)
CSN: [id number]     Arrival date & time 12/02/15  2343 History   First MD Initiated Contact with Patient 12/03/15 0002     Chief Complaint  Patient presents with  . Sore Throat     (Consider location/radiation/quality/duration/timing/severity/associated sxs/prior Treatment) HPI   Patient is a 20 year old female in no pertinent past medical history presents the ED with complaint of sore throat, onset 3 days. Patient reports having sore throat with associated dysphagia, fever (100), nasal congestion and cough. She notes she took DayQuil yesterday which improved her fever and nasal congestion but notes her sore throat has continued. Denies any recent sick contacts. Denies headache, visual changes, facial/neck swelling, trismus, drooling, voice change, difficulty breathing, wheezing, chest pain, nausea, vomiting.  History reviewed. No pertinent past medical history. History reviewed. No pertinent past surgical history. No family history on file. Social History  Substance Use Topics  . Smoking status: Never Smoker   . Smokeless tobacco: None  . Alcohol Use: No   OB History    No data available     Review of Systems  Constitutional: Positive for fever.  HENT: Positive for congestion and sore throat.   Respiratory: Positive for cough.   All other systems reviewed and are negative.     Allergies  Review of patient's allergies indicates no known allergies.  Home Medications   Prior to Admission medications   Medication Sig Start Date End Date Taking? Authorizing Provider  HYDROcodone-acetaminophen (HYCET) 7.5-325 mg/15 ml solution Take 15 mLs by mouth every 8 (eight) hours as needed for moderate pain. 12/03/15   Barrett HenleNicole Elizabeth Zipporah Finamore, PA-C  medroxyPROGESTERone (DEPO-PROVERA) 150 MG/ML injection Inject 150 mg into the muscle every 3 (three) months. 03/16/14   Historical Provider, MD  meloxicam (MOBIC) 15 MG tablet Take 1 tablet (15 mg total) by mouth daily. 07/25/14   Emilia BeckKaitlyn  Szekalski, PA-C  methocarbamol (ROBAXIN) 500 MG tablet Take 1 tablet (500 mg total) by mouth 2 (two) times daily. 07/25/14   Kaitlyn Szekalski, PA-C   BP 117/77 mmHg  Pulse 96  Temp(Src) 97.9 F (36.6 C) (Oral)  Resp 16  Ht 5\' 4"  (1.626 m)  Wt 74.617 kg  BMI 28.22 kg/m2  SpO2 100%  LMP 11/15/2015 Physical Exam  Constitutional: She is oriented to person, place, and time. She appears well-developed and well-nourished.  HENT:  Head: Normocephalic and atraumatic.  Right Ear: Tympanic membrane normal.  Left Ear: Tympanic membrane normal.  Nose: Nose normal. Right sinus exhibits no maxillary sinus tenderness and no frontal sinus tenderness. Left sinus exhibits no maxillary sinus tenderness and no frontal sinus tenderness.  Mouth/Throat: Uvula is midline and mucous membranes are normal. No oral lesions. No trismus in the jaw. No uvula swelling. Posterior oropharyngeal erythema (mild) present. No oropharyngeal exudate, posterior oropharyngeal edema or tonsillar abscesses.  No facial or neck swelling.  Eyes: Conjunctivae and EOM are normal. Pupils are equal, round, and reactive to light. Right eye exhibits no discharge. Left eye exhibits no discharge. No scleral icterus.  Neck: Normal range of motion. Neck supple.  Cardiovascular: Normal rate, regular rhythm, normal heart sounds and intact distal pulses.   Pulmonary/Chest: Effort normal and breath sounds normal. No respiratory distress. She has no wheezes. She has no rales. She exhibits no tenderness.  Abdominal: Soft. Bowel sounds are normal. There is no tenderness.  Musculoskeletal: She exhibits no edema.  Lymphadenopathy:    She has no cervical adenopathy.  Neurological: She is alert and oriented to person, place, and time.  Skin: Skin is warm and dry.  Nursing note and vitals reviewed.   ED Course  Procedures (including critical care time) Labs Review Labs Reviewed  RAPID STREP SCREEN (NOT AT [facility])  CULTURE, GROUP A STREP [facility])     Imaging Review No results found. I have personally reviewed and evaluated these images and lab results as part of my medical decision-making.   EKG Interpretation None      MDM   Final diagnoses:  Sore throat    Pt afebrile without tonsillar exudate, mild cervical lymphadenopathy, mild posterior oropharyngeal erythema & dysphagia; Centor score 1. Strep negative. I suspect patient's symptoms are likely due to viral pharyngitis. Pt appears well hydrated, discussed importance of continued water hydration. Presentation non concerning for PTA or infxn spread to soft tissue. No trismus or uvula deviation. Specific return precautions discussed. Plan to d/c pt home with symptomatic tx. Recommended PCP follow up, given outpatient follow up info.  Discussed return precautions.    Satira Sark [place], [place] 12/03/15 1610  Shon Baton, MD 12/03/15 [phone]

## 2015-12-03 NOTE — ED Notes (Signed)
The pt has ha d swollen glands and a sorethroat since Thursday.

## 2015-12-03 NOTE — Discharge Instructions (Signed)
Take your medications as prescribed as needed for pain relief. I also recommend continuing to drink fluids, at least six 8 ounce glasses of water daily, to remain hydrated at home. Please follow up with a primary care provider from the Resource Guide provided below in 5 days if your symptoms have not improved. Please return to the Emergency Department if symptoms worsen or new onset of fever, headache, facial/neck swelling, neck stiffness, unable to swallow resulting in drooling, unable to open jaw, change in voice, difficulty breathing.

## 2015-12-05 LAB — CULTURE, GROUP A STREP (THRC)

## 2017-01-20 ENCOUNTER — Emergency Department (HOSPITAL_COMMUNITY): Payer: BLUE CROSS/BLUE SHIELD

## 2017-01-20 ENCOUNTER — Encounter (HOSPITAL_COMMUNITY): Payer: Self-pay | Admitting: Emergency Medicine

## 2017-01-20 ENCOUNTER — Emergency Department (HOSPITAL_COMMUNITY)
Admission: EM | Admit: 2017-01-20 | Discharge: 2017-01-20 | Disposition: A | Discharge status: Home / Self Care | Payer: BLUE CROSS/BLUE SHIELD | Attending: Emergency Medicine | Admitting: Emergency Medicine

## 2017-01-20 DIAGNOSIS — M533 Sacrococcygeal disorders, not elsewhere classified: Secondary | ICD-10-CM | POA: Diagnosis present

## 2017-01-20 DIAGNOSIS — Z79899 Other long term (current) drug therapy: Secondary | ICD-10-CM | POA: Diagnosis not present

## 2017-01-20 DIAGNOSIS — Y939 Activity, unspecified: Secondary | ICD-10-CM | POA: Diagnosis not present

## 2017-01-20 DIAGNOSIS — W108XXA Fall (on) (from) other stairs and steps, initial encounter: Secondary | ICD-10-CM | POA: Insufficient documentation

## 2017-01-20 DIAGNOSIS — Y929 Unspecified place or not applicable: Secondary | ICD-10-CM | POA: Diagnosis not present

## 2017-01-20 DIAGNOSIS — S300XXA Contusion of lower back and pelvis, initial encounter: Secondary | ICD-10-CM | POA: Diagnosis not present

## 2017-01-20 DIAGNOSIS — Y999 Unspecified external cause status: Secondary | ICD-10-CM | POA: Diagnosis not present

## 2017-01-20 DIAGNOSIS — W19XXXA Unspecified fall, initial encounter: Secondary | ICD-10-CM

## 2017-01-20 MED ORDER — DICLOFENAC SODIUM 50 MG PO TBEC: 50.0000 mg | DELAYED_RELEASE_TABLET | Freq: Two times a day (BID) | ORAL | 0 refills | Status: AC

## 2017-01-20 MED ORDER — CYCLOBENZAPRINE HCL 10 MG PO TABS: 10.0000 mg | ORAL_TABLET | Freq: Two times a day (BID) | ORAL | 0 refills | Status: AC | PRN

## 2017-01-20 NOTE — ED Notes (Signed)
Family came to [facility] asking how much longer family was informed that there was only 1 more person before her because she was going to [facility]. Family said they will talk to her to see what she wants to do.

## 2017-01-20 NOTE — ED Notes (Signed)
Patient up to desk asking to be d/c'd.  Patient updated on rooming, and decided to stay.

## 2017-01-20 NOTE — ED Triage Notes (Signed)
Pt. Stated I slipped on a rug and went down 3 steps and landed on my tailbone.

## 2017-01-20 NOTE — Discharge Instructions (Signed)
The muscle relaxant will make you sleepy. Do not drive while taking it.  °

## 2017-01-20 NOTE — ED Notes (Signed)
Patient is A&Ox4.  No signs of distress noted.  Please see providers complete history and physical exam.  

## 2017-01-20 NOTE — ED Notes (Signed)
Pt departed in NAD, refused use of wheelchair.  

## 2017-01-20 NOTE — ED Provider Notes (Signed)
[facility]-EMERGENCY DEPT Provider Note   CSN: [id number] Arrival date & time: 01/20/17  1655  By signing my name below, I, Deland PrettySherilynn Knight, attest that this documentation has been prepared under the direction and in the presence of Kerrie BuffaloHope Brooklyne Radke, NP Electronically Signed: Deland PrettySherilynn Knight, ED Scribe. 01/20/17. 9:45 PM.  History   Chief Complaint Chief Complaint  Patient presents with  . Fall  . Tailbone Pain   The history is provided by the patient. No language interpreter was used.  Fall  This is a new problem. Pertinent negatives include no headaches.   HPI Comments: Kristin Sexton is a 21 y.o. female who presents to the Emergency Department complaining of gradually worsening, moderate tailbone pain s/p a mechanical fall that occurred PTA today. Denies head injury and LOC.  The pt states that her carpet was loose and fell down about 4 steps and landed on her tailbone. The pt also reports that sitting exacerbates her pain. She denies fever and neck pain.   History reviewed. No pertinent past medical history.  There are no active problems to display for this patient.   History reviewed. No pertinent surgical history.  OB History    No data available       Home Medications    Prior to Admission medications   Medication Sig Start Date End Date Taking? Authorizing Provider  cyclobenzaprine (FLEXERIL) 10 MG tablet Take 1 tablet (10 mg total) by mouth 2 (two) times daily as needed for muscle spasms. 01/20/17   Janne NapoleonNeese, Georgena Weisheit M, NP  diclofenac (VOLTAREN) 50 MG EC tablet Take 1 tablet (50 mg total) by mouth 2 (two) times daily. 01/20/17   Janne NapoleonNeese, Mikella Linsley M, NP  HYDROcodone-acetaminophen (HYCET) 7.5-325 mg/15 ml solution Take 15 mLs by mouth every 8 (eight) hours as needed for moderate pain. 12/03/15   Barrett HenleNadeau, Nicole Elizabeth, PA-C  medroxyPROGESTERone (DEPO-PROVERA) 150 MG/ML injection Inject 150 mg into the muscle every 3 (three) months. 03/16/14   [provider]    Family  History No family history on file.  Social History Social History  Substance Use Topics  . Smoking status: Never Smoker  . Smokeless tobacco: Never Used  . Alcohol use Yes     Allergies   Patient has no known allergies.   Review of Systems Review of Systems  Constitutional: Negative for fever.  Eyes: Negative for visual disturbance.  Gastrointestinal: Negative for nausea and vomiting.       No bowel incontinence.  Musculoskeletal: Positive for myalgias. Negative for neck pain.  Skin: Negative for wound.  Neurological: Negative for headaches.  Psychiatric/Behavioral: Negative for confusion.     Physical Exam Updated Vital Signs BP (!) 111/95 (BP Location: Left Arm)   Pulse 99   Temp 98.6 F (37 C) (Oral)   Resp 19   Ht 5\' 4"  (1.626 m)   Wt 83.9 kg (185 lb)   LMP 01/19/2017   SpO2 96%   BMI 31.76 kg/m   Physical Exam  Constitutional: She is oriented to person, place, and time. She appears well-developed and well-nourished. No distress.  HENT:  Head: Normocephalic.  Eyes: Pupils are equal, round, and reactive to light. EOM are normal. Right conjunctiva is not injected. Left conjunctiva is injected.  Left sclera is slightly injected. Good occular movement.  Neck: Normal range of motion.  ROM normal. No cervical tenderness.   Cardiovascular: Normal rate, regular rhythm and intact distal pulses.   Pulses:      Radial pulses are 2+ on the  right side, and 2+ on the left side.  No CVA tenderness.  Pulmonary/Chest: Effort normal and breath sounds normal.  Abdominal: Soft. There is no tenderness.  Musculoskeletal: Normal range of motion.  No tenderness of the thoracic and lumbar spine.  There is tenderness to the coccyx area.  Neurological: She is alert and oriented to person, place, and time. She has normal strength. No sensory deficit. Gait normal.  Steady gait, no foot drag. Strength and sensation are equal and normal.  Skin: Skin is warm and dry.  Psychiatric:  She has a normal mood and affect.  Nursing note and vitals reviewed.    ED Treatments / Results   DIAGNOSTIC STUDIES: Oxygen Saturation is 96% on RA, normal by my interpretation.   COORDINATION OF CARE: 9:41 PM-Discussed next steps with pt. Pt verbalized understanding and is agreeable with the plan.   Labs (all labs ordered are listed, but only abnormal results are displayed) Labs Reviewed - No data to display  Radiology Dg Sacrum/coccyx  Result Date: 01/20/2017 CLINICAL DATA:  Fall down stairs, tailbone injury EXAM: SACRUM AND COCCYX - 2+ VIEW COMPARISON:  None. FINDINGS: No displaced sacrococcygeal fracture is seen. Visualized bony pelvis appears intact. Bilateral hip joint spaces are symmetric. IMPRESSION: Negative. Electronically Signed   By: Charline BillsSriyesh  Krishnan M.D.   On: 01/20/2017 18:05    Procedures Procedures (including critical care time)  Medications Ordered in ED Medications - No data to display   Initial Impression / Assessment and Plan / ED Course  I have reviewed the triage vital signs and the nursing notes.  Pertinent imaging results that were available during my care of the patient were reviewed by me and considered in my medical decision making (see chart for details).  Final Clinical Impressions(s) / ED Diagnoses  21 y.o. female with coccyx pain s/p fall stable for d/c without neuro deficits. Will treat for contusion and she will f/u with her PCP or return here for worsening symptoms  Final diagnoses:  Fall  Contusion of coccyx, initial encounter  Fall, initial encounter    New Prescriptions Discharge Medication List as of 01/20/2017  9:49 PM    START taking these medications   Details  cyclobenzaprine (FLEXERIL) 10 MG tablet Take 1 tablet (10 mg total) by mouth 2 (two) times daily as needed for muscle spasms., Starting Tue 01/20/2017, Print    diclofenac (VOLTAREN) 50 MG EC tablet Take 1 tablet (50 mg total) by mouth 2 (two) times daily., Starting  Tue 01/20/2017, Print      I personally performed the services described in this documentation, which was scribed in my presence. The recorded information has been reviewed and is accurate.    Kerrie Buffaloeese, Aerik Polan [place], [place] 01/21/17 1617    Mancel BaleWentz, [place], MD 01/22/17 [phone]

## 2018-10-18 ENCOUNTER — Other Ambulatory Visit: Payer: Self-pay

## 2018-10-18 DIAGNOSIS — Z79899 Other long term (current) drug therapy: Secondary | ICD-10-CM | POA: Diagnosis not present

## 2018-10-18 DIAGNOSIS — R0789 Other chest pain: Secondary | ICD-10-CM | POA: Diagnosis not present

## 2018-10-18 DIAGNOSIS — F419 Anxiety disorder, unspecified: Secondary | ICD-10-CM | POA: Insufficient documentation

## 2018-10-18 DIAGNOSIS — R079 Chest pain, unspecified: Secondary | ICD-10-CM | POA: Diagnosis present

## 2018-10-18 LAB — CBC
HCT: 40.6 % (ref 36.0–46.0)
Hemoglobin: 14.3 g/dL (ref 12.0–15.0)
MCH: 27.7 pg (ref 26.0–34.0)
MCHC: 35.2 g/dL (ref 30.0–36.0)
MCV: 78.7 fL — ABNORMAL LOW (ref 80.0–100.0)
Platelets: 312 10*3/uL (ref 150–400)
RBC: 5.16 MIL/uL — ABNORMAL HIGH (ref 3.87–5.11)
RDW: 12.4 % (ref 11.5–15.5)
WBC: 7.7 10*3/uL (ref 4.0–10.5)
nRBC: 0 % (ref 0.0–0.2)

## 2018-10-18 LAB — BASIC METABOLIC PANEL
Anion gap: 9 (ref 5–15)
BUN: 13 mg/dL (ref 6–20)
CO2: 21 mmol/L — ABNORMAL LOW (ref 22–32)
Calcium: 8.9 mg/dL (ref 8.9–10.3)
Chloride: 109 mmol/L (ref 98–111)
Creatinine, Ser: 0.73 mg/dL (ref 0.44–1.00)
GFR calc Af Amer: >60 mL/min (ref >60)
GFR calc non Af Amer: >60 mL/min (ref >60)
Glucose, Bld: 94 mg/dL (ref 70–99)
Potassium: 3.9 mmol/L (ref 3.5–5.1)
Sodium: 139 mmol/L (ref 135–145)

## 2018-10-18 LAB — TROPONIN I: Troponin I: <0.03 ng/mL (ref <0.03)

## 2018-10-18 LAB — POCT PREGNANCY, URINE: Preg Test, Ur: NEGATIVE

## 2018-10-18 MED ORDER — SODIUM CHLORIDE 0.9% FLUSH: 3.0000 mL | Freq: Once | INTRAVENOUS | Status: DC

## 2018-10-18 NOTE — ED Triage Notes (Signed)
Pt arrives to ED via POV from home with c/o centralized chest pain without radiation "off and on for four days". Pt (+) SHOB "when I'm really active but that's it" and "some nausea this morning". Pt denies fever, no cough, diaphoresis, vomiting or diarrhea. Pt reports that  "standing and walking around for a minute" improves the pain, and "sneezing, I guess" makes it worse. Pt denies previous cardiac h/x; no h/x of HTN or DM. Pt is A&O, in NAD; RR even, regular, and unlabored,.

## 2018-10-19 ENCOUNTER — Emergency Department

## 2018-10-19 ENCOUNTER — Emergency Department
Admission: EM | Admit: 2018-10-19 | Discharge: 2018-10-19 | Disposition: A | Discharge status: Home / Self Care | Attending: Emergency Medicine | Admitting: Emergency Medicine

## 2018-10-19 DIAGNOSIS — R0789 Other chest pain: Secondary | ICD-10-CM

## 2018-10-19 DIAGNOSIS — F419 Anxiety disorder, unspecified: Secondary | ICD-10-CM

## 2018-10-19 NOTE — ED Notes (Signed)
Pt resting comfortably in bed. Denies any needs.

## 2018-10-19 NOTE — ED Notes (Signed)
Pt verbalized understanding of d/c instructions, and f/u care. No further questions at this time. Pt ambulatory to the exit with steady gait.  

## 2018-10-19 NOTE — ED Notes (Addendum)
Pt states medial CP at worst is a 7/10 but is intermittent. States currently no CP. States back pain at worst is 6/10 but currently at a 4/10. Denies pain radiating to anywhere else. Denies SOB. Denies dizziness. States intermittent nausea. Not currently nauseous. Denies fever. Pt denies anxiety currently but states she has experienced it in the past.

## 2018-10-19 NOTE — ED Notes (Addendum)
Offered pt blanket but declined; bed in lowest position, rail up, call bell within reach and pt educated about using it, pt given tv remote.

## 2018-10-19 NOTE — Discharge Instructions (Signed)
You have been seen in the Emergency Department (ED) today for chest pain.  As we have discussed today?s test results are normal, and we feel it is likely that panic attacks may be causing your symptoms. ° °Please follow up with the recommended doctor as instructed above in these documents regarding today?s emergent visit and your recent symptoms to discuss further management.  Continue to take your regular medications.  ° °Return to the Emergency Department (ED) if you experience any further chest pain/pressure/tightness, difficulty breathing, or sudden sweating, or other symptoms that concern you. °

## 2018-10-19 NOTE — ED Provider Notes (Signed)
[facility] Emergency Department Provider Note  ____________________________________________   First MD Initiated Contact with Patient 10/19/18 [phone]     (approximate)  I have reviewed the triage vital signs and the nursing notes.   HISTORY  Chief Complaint Chest Pain    HPI Kristin Sexton is a 23 y.o. female with no chronic medical history presents for evaluation of intermittent chest pain for last 4 days.  She says that it occurs mostly when she is sitting and studying.  It feels better when she gets up and moves around and walks.  She says the pain is usually mild but sometimes can become severe.  It does not hurt when she pushes on the outside of her chest and in fact usually that makes it feel better.  No shortness of breath.  She says it is an aching pain that is in the middle of her chest.  She denies fever/chills, shortness of breath, cough, sore throat, nasal congestion, nausea, vomiting, and abdominal pain.  She has tried to stay isolated during the COVID-19 pandemic and has not been traveling or been in contact with anybody known to have coronavirus.  She has no history of blood clots in the legs in the lungs and no history of heart disease.  She uses a Mirena IUD but uses no estrogen supplements.  She has not been immobilized or had any recent surgeries.  No drug use and no tobacco use, occasional mild alcohol use.          History reviewed. No pertinent past medical history.  There are no active problems to display for this patient.   History reviewed. No pertinent surgical history.  Prior to Admission medications   Medication Sig Start Date End Date Taking? Authorizing Provider  cyclobenzaprine (FLEXERIL) 10 MG tablet Take 1 tablet (10 mg total) by mouth 2 (two) times daily as needed for muscle spasms. 01/20/17   Janne Napoleon, NP  diclofenac (VOLTAREN) 50 MG EC tablet Take 1 tablet (50 mg total) by mouth 2 (two) times daily. 01/20/17    Janne Napoleon, NP  HYDROcodone-acetaminophen (HYCET) 7.5-325 mg/15 ml solution Take 15 mLs by mouth every 8 (eight) hours as needed for moderate pain. 12/03/15   Barrett Henle, PA-C  medroxyPROGESTERone (DEPO-PROVERA) 150 MG/ML injection Inject 150 mg into the muscle every 3 (three) months. 03/16/14   [provider]    Allergies Patient has no known allergies.  No family history on file.  Social History Social History   Tobacco Use  . Smoking status: Never Smoker  . Smokeless tobacco: Never Used  Substance Use Topics  . Alcohol use: Yes  . Drug use: No    Review of Systems Constitutional: No fever/chills Eyes: No visual changes. ENT: No sore throat. Cardiovascular: Intermittent aching chest pain over the last 4 days as described above. Respiratory: Denies shortness of breath. Gastrointestinal: No abdominal pain.  No nausea, no vomiting.  No diarrhea.  No constipation. Genitourinary: Negative for dysuria. Musculoskeletal: Negative for neck pain.  Negative for back pain. Integumentary: Negative for rash. Neurological: Negative for headaches, focal weakness or numbness.   ____________________________________________   PHYSICAL EXAM:  VITAL SIGNS: ED Triage Vitals  Enc Vitals Group     BP 10/18/18 2202 111/88     Pulse Rate 10/18/18 2202 87     Resp 10/18/18 2202 18     Temp 10/18/18 2202 98.6 F (37 C)     Temp Source 10/18/18 2202 Oral  SpO2 10/18/18 2202 99 %     Weight 10/18/18 2203 86.2 kg (190 lb)     Height 10/18/18 2203 1.626 m ( )     Head Circumference --      Peak Flow --      Pain Score 10/18/18 2203 4     Pain Loc --      Pain Edu? --      Excl. in GC? --     Constitutional: Alert and oriented. Well appearing and in no acute distress. Eyes: Conjunctivae are normal.  Head: Atraumatic. Nose: No congestion/rhinnorhea. Mouth/Throat: Mucous membranes are moist. Neck: No stridor.  No meningeal signs.   Cardiovascular:  Normal rate, regular rhythm. Good peripheral circulation. Grossly normal heart sounds. Respiratory: Normal respiratory effort.  No retractions. No audible wheezing. Gastrointestinal: Soft and nontender. No distention.  Musculoskeletal: No lower extremity tenderness nor edema. No gross deformities of extremities.  No reproducible chest wall tenderness. Neurologic:  Normal speech and language. No gross focal neurologic deficits are appreciated.  Skin:  Skin is warm, dry and intact. No rash noted. Psychiatric: Mood and affect are normal. Speech and behavior are normal.  ____________________________________________   LABS (all labs ordered are listed, but only abnormal results are displayed)  Labs Reviewed  BASIC METABOLIC PANEL - Abnormal; Notable for the following components:      Result Value   CO2 21 (*)    All other components within normal limits  CBC - Abnormal; Notable for the following components:   RBC 5.16 (*)    MCV 78.7 (*)    All other components within normal limits  TROPONIN I  POC URINE PREG, ED  POCT PREGNANCY, URINE   ____________________________________________  EKG  ED ECG REPORT I, Loleta Rose, the attending physician, personally viewed and interpreted this ECG.  Date: 10/18/2018 EKG Time: 21: 59 Rate: 87 Rhythm: normal sinus rhythm QRS Axis: normal Intervals: normal ST/T Wave abnormalities: normal Narrative Interpretation: no evidence of acute ischemia  ____________________________________________  RADIOLOGY I, Loleta Rose, personally viewed and evaluated these images (plain radiographs) as part of my medical decision making, as well as reviewing the written report by the radiologist.  ED MD interpretation: No acute abnormality on chest x-ray  Official radiology report(s): Dg Chest 2 View  Result Date: 10/19/2018 CLINICAL DATA:  23 year old female with chest pain for 4 days. EXAM: CHEST - 2 VIEW COMPARISON:  None. FINDINGS: Somewhat low lung  volumes. Normal cardiac size and mediastinal contours. Visualized tracheal air column is within normal limits. Both lungs appear clear. No pneumothorax or pleural effusion. No osseous abnormality identified. Negative visible bowel gas pattern. IMPRESSION: Negative.  No acute cardiopulmonary abnormality. Electronically Signed   By: Odessa Fleming M.D.   On: 10/19/2018 02:07    ____________________________________________   PROCEDURES   Procedure(s) performed (including Critical Care):  Procedures   ____________________________________________   INITIAL IMPRESSION / MDM / ASSESSMENT AND PLAN / ED COURSE  As part of my medical decision making, I reviewed the following data within the electronic MEDICAL RECORD NUMBER Nursing notes reviewed and incorporated, Labs reviewed , EKG interpreted , Old chart reviewed, Radiograph reviewed  and Notes from prior ED visits      Charlita Fran Neiswonger was evaluated in Emergency Department on 10/19/2018 for the symptoms described in the history of present illness. She was evaluated in the context of the global COVID-19 pandemic, which necessitated consideration that the patient might be at risk for infection with the SARS-CoV-2 virus that  causes COVID-19. Institutional protocols and algorithms that pertain to the evaluation of patients at risk for COVID-19 are in a state of rapid change based on information released by regulatory bodies including the CDC and federal and state organizations. These policies and algorithms were followed during the patient's care in the ED.  Differential diagnosis includes, but is not limited to, panic attack/anxiety, musculoskeletal pain, pericarditis, ACS, PE, pneumonia, less likely COVID-19.  The patient is well-appearing and in no distress with normal vital signs.  She is PERC negative and is very low risk for ACS.  Her symptoms are not consistent with an acute or emergent medical condition and are more consistent with stress and anxiety.   Her lab work is all reasonable and within normal limits including a negative troponin.  She is comfortable with the plan for discharge and outpatient follow-up and in fact she brought up her self the fact that this could be the result of anxiety.  I encouraged her to follow-up with an outpatient doctor and I gave my usual customary return precautions.  She understands and agrees with the plan.      ____________________________________________  FINAL CLINICAL IMPRESSION(S) / ED DIAGNOSES  Final diagnoses:  Atypical chest pain  Anxiety     MEDICATIONS GIVEN DURING THIS VISIT:  Medications  sodium chloride flush (NS) 0.9 % injection 3 mL (has no administration in time range)     ED Discharge Orders    None       Note:  This document was prepared using Dragon voice recognition software and may include unintentional dictation errors.   Loleta RoseForbach, Abigaile Rossie, MD 10/19/18 [phone]

## 2020-04-05 IMAGING — CR CHEST - 2 VIEW
1 series · 2 of 2 positions shown · non-contrast
Comparison: None.

CLINICAL DATA: 22-year-old female with chest pain for 4 days.

EXAM:
CHEST - 2 VIEW

[Series 1: dg chest 2 view · 0.14mm/px · 2 of 2 slices shown]
[im 1/2]
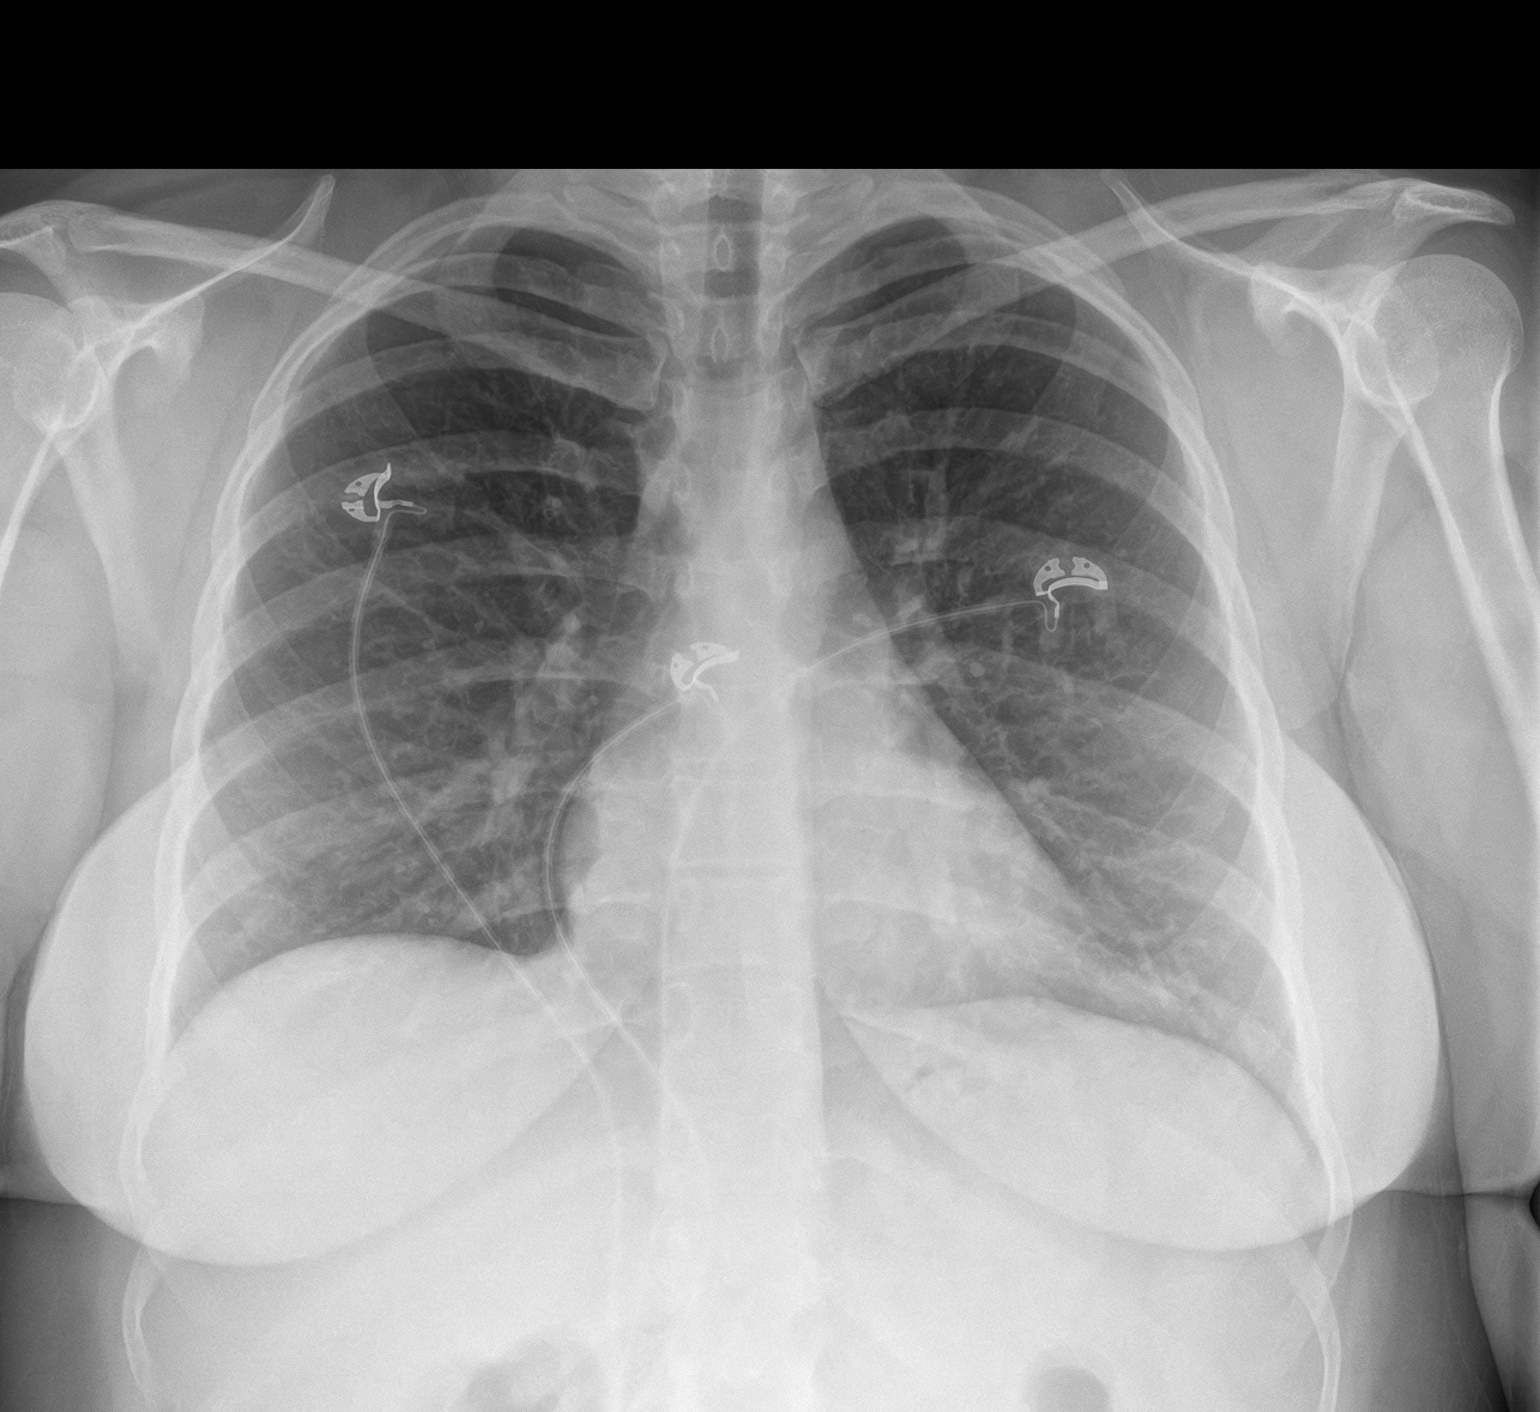
[im 2/2]
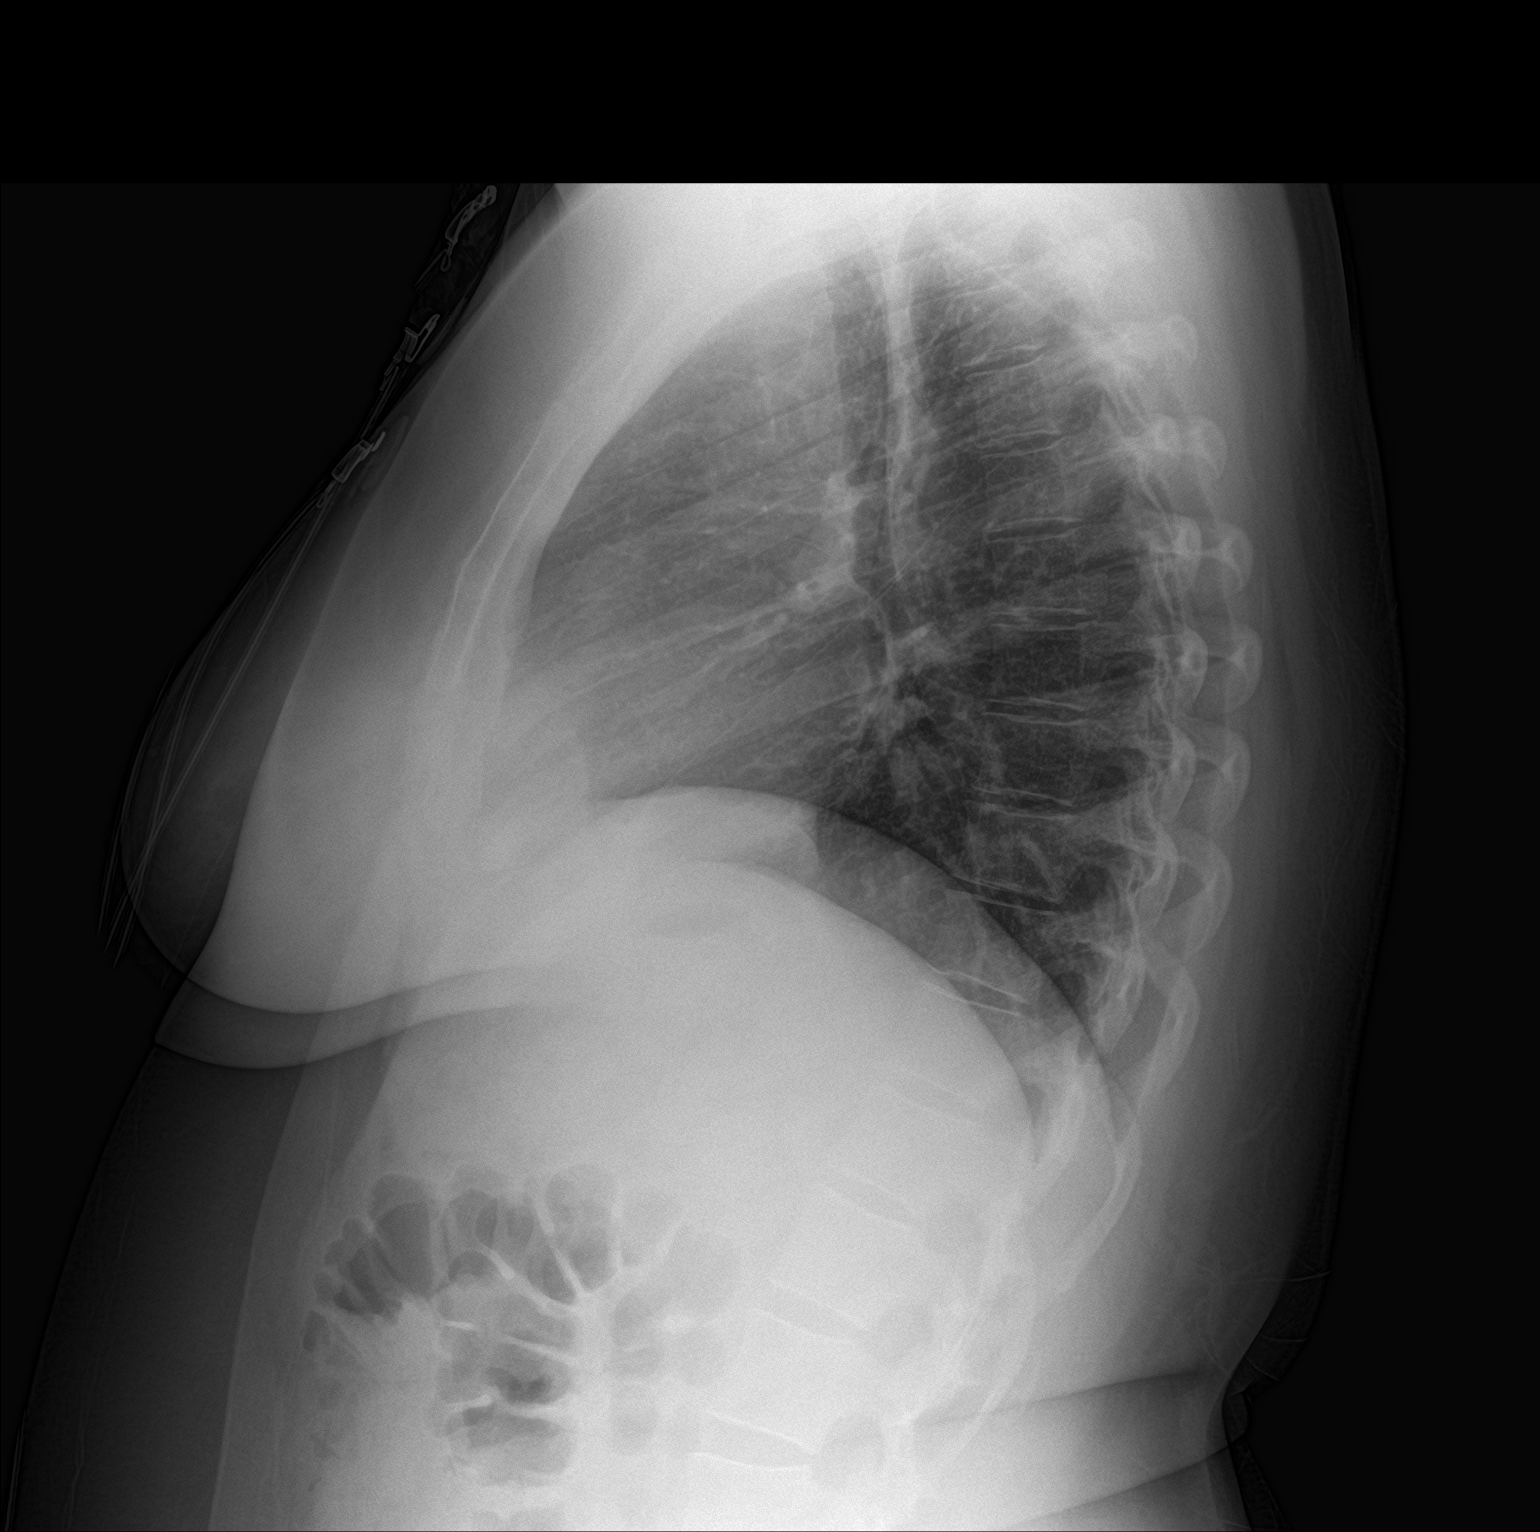

[2 of 2 positions shown; findings below may reference images not displayed]

FINDINGS: Somewhat low lung volumes. Normal cardiac size and mediastinal
contours. Visualized tracheal air column is within normal limits.
Both lungs appear clear. No pneumothorax or pleural effusion. No
osseous abnormality identified. Negative visible bowel gas pattern.
IMPRESSION: Negative.  No acute cardiopulmonary abnormality.
